# Patient Record
Sex: Female | Born: 1965 | Race: White | Hispanic: No | Marital: Married | Smoking: Former smoker
Health system: Southern US, Community
[De-identification: ages and names within clinical notes are randomized; demographics above are authoritative.]

## PROBLEM LIST (undated history)

## (undated) DIAGNOSIS — E079 Disorder of thyroid, unspecified: Secondary | ICD-10-CM

## (undated) HISTORY — DX: Disorder of thyroid, unspecified: E07.9

---

## 2004-01-01 HISTORY — PX: SPINE SURGERY: SHX786

## 2008-11-08 ENCOUNTER — Encounter: Admission: RE | Admit: 2008-11-08 | Discharge: 2008-11-08 | Payer: Self-pay | Admitting: Orthopedic Surgery

## 2009-12-31 HISTORY — PX: BREAST SURGERY: SHX581

## 2010-11-24 ENCOUNTER — Encounter: Admission: RE | Admit: 2010-11-24 | Discharge: 2010-11-24 | Payer: Self-pay | Admitting: Orthopedic Surgery

## 2010-12-31 HISTORY — PX: SPINE SURGERY: SHX786

## 2011-03-16 ENCOUNTER — Encounter (HOSPITAL_COMMUNITY)
Admission: RE | Admit: 2011-03-16 | Discharge: 2011-03-16 | Disposition: A | Discharge status: Home / Self Care | Payer: Managed Care, Other (non HMO) | Source: Ambulatory Visit | Attending: Orthopedic Surgery | Admitting: Orthopedic Surgery

## 2011-03-16 ENCOUNTER — Other Ambulatory Visit (HOSPITAL_COMMUNITY): Payer: Self-pay | Admitting: Orthopedic Surgery

## 2011-03-16 ENCOUNTER — Ambulatory Visit (HOSPITAL_COMMUNITY)
Admission: RE | Admit: 2011-03-16 | Discharge: 2011-03-16 | Disposition: A | Discharge status: Home / Self Care | Payer: Managed Care, Other (non HMO) | Source: Ambulatory Visit | Attending: Orthopedic Surgery | Admitting: Orthopedic Surgery

## 2011-03-16 DIAGNOSIS — Z01812 Encounter for preprocedural laboratory examination: Secondary | ICD-10-CM | POA: Insufficient documentation

## 2011-03-16 DIAGNOSIS — J329 Chronic sinusitis, unspecified: Secondary | ICD-10-CM | POA: Insufficient documentation

## 2011-03-16 DIAGNOSIS — E039 Hypothyroidism, unspecified: Secondary | ICD-10-CM | POA: Insufficient documentation

## 2011-03-16 DIAGNOSIS — M1990 Unspecified osteoarthritis, unspecified site: Secondary | ICD-10-CM | POA: Insufficient documentation

## 2011-03-16 DIAGNOSIS — M48061 Spinal stenosis, lumbar region without neurogenic claudication: Secondary | ICD-10-CM

## 2011-03-16 DIAGNOSIS — M51379 Other intervertebral disc degeneration, lumbosacral region without mention of lumbar back pain or lower extremity pain: Secondary | ICD-10-CM | POA: Insufficient documentation

## 2011-03-16 DIAGNOSIS — Z01818 Encounter for other preprocedural examination: Secondary | ICD-10-CM | POA: Insufficient documentation

## 2011-03-16 DIAGNOSIS — R569 Unspecified convulsions: Secondary | ICD-10-CM | POA: Insufficient documentation

## 2011-03-16 DIAGNOSIS — K219 Gastro-esophageal reflux disease without esophagitis: Secondary | ICD-10-CM | POA: Insufficient documentation

## 2011-03-16 DIAGNOSIS — M5137 Other intervertebral disc degeneration, lumbosacral region: Secondary | ICD-10-CM | POA: Insufficient documentation

## 2011-03-16 DIAGNOSIS — F411 Generalized anxiety disorder: Secondary | ICD-10-CM | POA: Insufficient documentation

## 2011-03-16 LAB — URINALYSIS, ROUTINE W REFLEX MICROSCOPIC
Glucose, UA: NEGATIVE mg/dL
Hgb urine dipstick: NEGATIVE
Protein, ur: NEGATIVE mg/dL

## 2011-03-16 LAB — COMPREHENSIVE METABOLIC PANEL
BUN: 8 mg/dL (ref 6–23)
Calcium: 9.7 mg/dL (ref 8.4–10.5)
Glucose, Bld: 86 mg/dL (ref 70–99)
Sodium: 138 mEq/L (ref 135–145)
Total Protein: 7.2 g/dL (ref 6.0–8.3)

## 2011-03-16 LAB — DIFFERENTIAL
Basophils Absolute: 0 10*3/uL (ref 0.0–0.1)
Lymphocytes Relative: 37 % (ref 12–46)
Lymphs Abs: 1.9 10*3/uL (ref 0.7–4.0)
Monocytes Absolute: 0.3 10*3/uL (ref 0.1–1.0)
Monocytes Relative: 5 % (ref 3–12)
Neutro Abs: 2.8 10*3/uL (ref 1.7–7.7)

## 2011-03-16 LAB — CBC
HCT: 41.5 % (ref 36.0–46.0)
Hemoglobin: 14.1 g/dL (ref 12.0–15.0)
MCHC: 34 g/dL (ref 30.0–36.0)
MCV: 85 fL (ref 78.0–100.0)

## 2011-03-16 LAB — PROTIME-INR
INR: 0.9 (ref 0.00–1.49)
Prothrombin Time: 12.4 seconds (ref 11.6–15.2)

## 2011-03-16 LAB — TYPE AND SCREEN
ABO/RH(D): A NEG
Antibody Screen: NEGATIVE

## 2011-03-16 LAB — SURGICAL PCR SCREEN
MRSA, PCR: NEGATIVE
Staphylococcus aureus: POSITIVE — AB

## 2011-03-17 LAB — VITAMIN D 25 HYDROXY (VIT D DEFICIENCY, FRACTURES): Vit D, 25-Hydroxy: 38 ng/mL (ref 30–89)

## 2011-03-20 ENCOUNTER — Other Ambulatory Visit: Payer: Self-pay | Admitting: Orthopedic Surgery

## 2011-03-20 ENCOUNTER — Inpatient Hospital Stay (HOSPITAL_COMMUNITY)
Admission: RE | Admit: 2011-03-20 | Discharge: 2011-03-23 | DRG: 460 | Disposition: A | Discharge status: Home / Self Care | Payer: Managed Care, Other (non HMO) | Source: Ambulatory Visit | Attending: Orthopedic Surgery | Admitting: Orthopedic Surgery

## 2011-03-20 ENCOUNTER — Inpatient Hospital Stay (HOSPITAL_COMMUNITY): Payer: Managed Care, Other (non HMO)

## 2011-03-20 DIAGNOSIS — R51 Headache: Secondary | ICD-10-CM | POA: Diagnosis not present

## 2011-03-20 DIAGNOSIS — G40309 Generalized idiopathic epilepsy and epileptic syndromes, not intractable, without status epilepticus: Secondary | ICD-10-CM | POA: Diagnosis present

## 2011-03-20 DIAGNOSIS — M47817 Spondylosis without myelopathy or radiculopathy, lumbosacral region: Secondary | ICD-10-CM | POA: Diagnosis present

## 2011-03-20 DIAGNOSIS — M5126 Other intervertebral disc displacement, lumbar region: Principal | ICD-10-CM | POA: Diagnosis present

## 2011-03-20 DIAGNOSIS — K219 Gastro-esophageal reflux disease without esophagitis: Secondary | ICD-10-CM | POA: Diagnosis present

## 2011-03-20 DIAGNOSIS — E079 Disorder of thyroid, unspecified: Secondary | ICD-10-CM | POA: Diagnosis present

## 2011-03-20 LAB — HCG, SERUM, QUALITATIVE: Preg, Serum: NEGATIVE

## 2011-03-21 LAB — BASIC METABOLIC PANEL
Calcium: 7.7 mg/dL — ABNORMAL LOW (ref 8.4–10.5)
GFR calc Af Amer: >60 mL/min (ref >60)
GFR calc non Af Amer: >60 mL/min (ref >60)
Sodium: 138 mEq/L (ref 135–145)

## 2011-03-21 LAB — CBC
MCHC: 33.7 g/dL (ref 30.0–36.0)
RDW: 14 % (ref 11.5–15.5)

## 2011-03-22 LAB — BASIC METABOLIC PANEL
CO2: 28 mEq/L (ref 19–32)
Calcium: 7.5 mg/dL — ABNORMAL LOW (ref 8.4–10.5)
GFR calc Af Amer: >60 mL/min (ref >60)
Sodium: 137 mEq/L (ref 135–145)

## 2011-03-22 LAB — CBC
Hemoglobin: 7.7 g/dL — ABNORMAL LOW (ref 12.0–15.0)
MCHC: 33.2 g/dL (ref 30.0–36.0)
RBC: 2.68 MIL/uL — ABNORMAL LOW (ref 3.87–5.11)

## 2011-03-22 LAB — HEMOGLOBIN AND HEMATOCRIT, BLOOD
HCT: 24.4 % — ABNORMAL LOW (ref 36.0–46.0)
Hemoglobin: 8.3 g/dL — ABNORMAL LOW (ref 12.0–15.0)

## 2011-03-23 LAB — CBC
HCT: 25 % — ABNORMAL LOW (ref 36.0–46.0)
Platelets: 174 10*3/uL (ref 150–400)
RDW: 13.7 % (ref 11.5–15.5)
WBC: 5.5 10*3/uL (ref 4.0–10.5)

## 2011-03-26 NOTE — Op Note (Signed)
Amber Marquez, Amber Marquez                  ACCOUNT NO.:  [id number]  MEDICAL RECORD NO.:  [id number]           PATIENT TYPE:  I  LOCATION:  5034                         FACILITY:  [facility]  PHYSICIAN:  Nelda Severe, MD      DATE OF BIRTH:  09/11/1966  DATE OF PROCEDURE:  03/20/2011 DATE OF DISCHARGE:                              OPERATIVE REPORT   SURGEON:  Nelda Severe, MD  ASSISTANT:  Lianne Cure, PA  PREOPERATIVE DIAGNOSIS:  Lumbar spondylosis, L4-5 stenosis/L4-5 disk herniation, L5-S1 spondylosis status post prior right L5-S1 laminectomy.  POSTOPERATIVE DIAGNOSIS:  Lumbar spondylosis, L4-5 stenosis/L4-5 disk herniation, L5-S1 spondylosis status post prior right L5-S1 laminectomy.  OPERATIVE PROCEDURE:  Bilateral laminectomy L4-5 and disk excision (separate procedure); posterior interbody fusions L5-S1 right side, L4-5 left side; posterolateral fusion L3 through S1 bilaterally with autogenous iliac crest graft, autogenous local graft, and a Bioglass; pedicle screws and rods L3 through S1 (except left L5); interbody cages L5-S1 and L4-5.  OPERATIVE NOTE:  The patient was placed under general endotracheal anesthesia.  Sequential compression devices were placed on both lower extremities.  A Foley catheter was placed in the bladder.  Monitoring electrodes were attached to upper and lower extremities and scalp.  A 1 g of vancomycin was infused intravenously.  The patient was positioned prone on a Jackson frame.  Care was taken to position the upper extremities so as to avoid hyperflexion and abduction of the shoulders and so as to avoid hyperflexion of the elbows.  Hips, knees, and ankles were gently flexed and the thighs, knees, shins, and feet were supported on pillows.  Lumbar area was prepped with DuraPrep and draped in rectangular fashion after marking the proposed site of incision, midline lower lumbar spine, and a small oblique incision over the right posterior iliac crest  with a skin marker.  Drapes were secured with Ioban.  A time-out was held at which point the usual parameters were discussed/confirmed.  Midline incision was made into the dermis and then the subcutaneous tissue was injected with a mixture of 0.25% plain Marcaine and 1% lidocaine with epinephrine.  Dissection was carried down to the spinous process of the distal lumbosacral spine.  Paraspinal muscles were reflected back bilaterally.  Cross-table lateral radiograph was taken with a Kocher on the spinous process and this conformed to our impression of last mobile segment.  On the right side, I exposed the ala of the sacrum, the transverse process of L4, L5, and L3.  I then packed the wound with antibiotic- soaked sponge and harvested a right posterior iliac crest graft through a short incision using a graft retaining disposable harvesting device. When as much graft as I could collect with this device had been collected, the bony void was packed with Gelfoam to control bleeding and the wound was closed later.  I then harvested some more bone graft locally by doing a facet joint resection on the right at L5-S1, L4-5, and L3-4.  This bone was morselized with the iliac crest graft and mixed with 10 mL of Bioglass.  I then made many pedicle holes at S1,  L5, L4, and L3 on the right side. This was done in the usual fashion, removing a small piece of bone from the base of superior articular process, using a pedicle probe to find and make a hole through the pedicle into the vertebral body.  Each hole was carefully palpated with a ball-tip probe to make sure it was circumferentially intact and sounded for depth.  I then decorticated the ala of the sacrum and the transverse processes at each level and bone graft was packed in posterolaterally.  We then placed S1, L5, L4, and L3 screws sequentially having tapped the holes appropriately.  The 7.5-mm screw was used at S1 and 6.5-mm diameter screws  at the other levels. Lengths were determined on the depth determinations for the holes.  I then proceeded with a posterior interbody fusion done through the right side at L5-S1, the site of a previous laminectomy.  Fortunately, the scar in the area was very flimsy.  The margins of the lamina were easily identified.  The apophyseal joint at L5-S1 on the right side was then resected using a combination of Kerrison and Leksell rongeurs.  The veins in the neural foramina were controlled with bipolar coagulation and the posterior aspect of the disk was identified.  The disk was then fenestrated.  It was very narrow.  I was able to get a 6-mm disk scraper into the disk and began the process of disk enucleation.  Ultimately, I placed a working rod between the screws of L5 and S1 on the right side and distracted it to create more space.  The disk was further enucleated and endplates were prepared using curettes.  We then used a Titan cage, 22 mm in length and 7 mm thick, packed with graft to insert into the interspace.  Prior to that, I used a final packed bone graft into the anterior L5-S1 disk space.  At this point, we stimulated all of pedicle screws and levels of distal EMG stimulation were all in a safe zone.  An appropriate length titanium rod was contoured and provisionally attached to the screws on the right side.  I then switched sides.  The ala of the sacrum and transverse processes of L5 through L3 proximally were exposed.  We then placed pedicle screws at S1 through L3 proximally in the same fashion as described for the right side, after raising some more local bone graft, adding it to the admixture of bone graft, preparing the ala of the sacrum and the transverse processes with decortication and placing posterolateral graft material from L3 through S1.  The screws were then placed on the patient's left side.  They were stimulated, and the levels to achieve distal EMG stimulation  were all in a safe zone.  I then began to do a posterior interbody fusion on the left side at L4- 5.  This was done because, all the patient has stenosis and a right- sided sciatic pain, there was a moderately large disk herniation superimposed upon the bony stenosis on the left side at L4-5.  A complete facetectomy was carried out, veins in the neural foramen bipolar coagulated, and the disk identified.  It was fenestrated and then enucleated in a similar fashion to that described for L5-S1, although there was much more disk material in this disk and an obvious posterolateral disk herniation.  Once I had satisfactorily prepared the endplates, an appropriate size Titan implant was packed with bone graft and having placed bone graft anteriorly in the disk space.  The implant was impacted into place.  Cross-table lateral radiograph was taken and showed satisfactory position of the implants except for the left L5 screw which had extruded into the L4-5 disk space proximally.  It was removed, and I left it out feeling that there was not really space to redirect a hole more distally and that we had very good fixation on the patient's right side.  I then switched sides of the table again and completed a laminectomy at L4-5 on the patient's right side because that was a symptomatic side. Partial facetectomy was carried out and the neural foramen was completely decompressed.  The L5 nerve root was visualized in the L5-S1 neural foramen below and at its origin at the L4-5 level above and a Murphy ball probe was used to follow the nerve root distally around the lamina and into the neural foramen at L5-S1.  The L4-5 neural foramen on the right side was also palpated with a Murphy ball probe and was opened.  We then used a high-speed bur to remove the articular cartilage from the superior articular process of L3 bilaterally and further decorticated the lamina of L3 bilaterally.  Bone graft  mixture was then packed into the L3-4 facet joints bilaterally.  We used the bone graft/Bioglass mixture which was left to reinforce the posterolateral fusion on the left from L3 through S1.  The laminectomies were all reinspected and a few pieces of bone graft were removed.  Pieces of Gelfoam were laid superficial to the laminectomy site to prevent the ingress of bone graft material.  We placed pieces of Gelfoam over the posterior bone graft at L3-4 to prevent it floating around in the wound.  Not mentioned above is the fact that a rod was also contoured for the left side and attached to the screws.  When a final cross-table lateral radiograph was taken which showed satisfactory position of all implants, the screws were torqued.  An 15-gauge Blake drain was placed subfascially and secured to the skin with a 2-0 nylon on the right side.  The thoracolumbar fascia was reapposed using a continuous #1 Vicryl suture.  A one-eighth-inch Hemovac drain was taken from the bone graft harvest site subcutaneous portion of the wound and run through the central wound and out through the skin to the right side, again being secured with a 2-0 nylon.  The central lumbar wound was then closed using 2-0 inverted Vicryl sutures in the subcutaneous layer as was the bone graft harvest subcutaneous layer.  The skin of both wounds was closed using subcuticular 3-0 undyed Vicryl in running fashion.  The skin edges were reinforced with Steri- Strips and a nonadherent dressing applied.  Blood loss estimated at 500-600 mL.  The patient received approximately 250 mL of Cell Saver blood.  There were no intraoperative complications. Sponge, needle counts were correct.  At the time of dictation, the patient has not been transferred to the recovery room, so no neurologic examination reported here.     Nelda Severe, MD     MT/MEDQ  D:  03/20/2011  T:  03/21/2011  Job:  [id number]  Electronically Signed by  Nelda Severe MD on 03/26/2011 02:13:26 PM

## 2011-04-04 NOTE — H&P (Signed)
  NAMEBETTYANNE, Amber Marquez                  ACCOUNT NO.:  [id number]  MEDICAL RECORD NO.:  [id number]           PATIENT TYPE:  O  LOCATION:  XRAY                         FACILITY:  [facility]  PHYSICIAN:  Nelda Severe, MD      DATE OF BIRTH:  1966/03/12  DATE OF ADMISSION:  03/16/2011 DATE OF DISCHARGE:  03/16/2011                             HISTORY & PHYSICAL   CHIEF COMPLAINT:  Lumbar pain with right lateral posterior leg sciatic pain.  She does have left hip and buttock pain.  ALLERGIES:  She has no known drug allergies.  CURRENT MEDICATIONS: 1. Hydrocodone 10 one to two q.4-6 p.r.n. for pain. 2. Neurontin 800 mg 4 times a day. 3. Synthroid 75 mg once a day. 4. Estrin birth control daily. 5. Flexeril 10 mg one q.8 p.r.n. for muscle spasms.  PAST MEDICAL HISTORY:  Breast reduction and lumbar laminectomy.  She has a medical history of osteoarthritis, acid reflux, hypothyroidism and new onset of seizure unknown cause and anxiety.  SOCIAL HISTORY:  She does not smoke and has never smoked.  She drinks socially.  FAMILY HISTORY:  Diabetes and hypertension.  REVIEW OF SYSTEMS:  She reports no fever, no chills, no night sweats, no nausea, vomiting or diarrhea.  No shortness of breath.  No chest pain. No chronic cough.  PHYSICAL EXAMINATION:  GENERAL:  She is normocephalic. EYES:  Pupils are equal, round and reactive to light. NECK:  Supple.  No adenopathy noted. CHEST:  Clear to auscultation bilaterally with no wheezes noted. HEART:  Regular rate and rhythm.  No murmur noted. ABDOMEN:  Soft, nontender to palpation.  Positive bowel sounds auscultated. EXTREMITIES:  She has right lower extremity posterior lateral pain radiation sciatic, left buttock and hip pain, tenderness to palpation L3-S1 centrally.  On the MRI and x-ray, she has degenerative disk at L3-4, L4-5, L5-S1, status post laminectomy with herniated disk at L3-4 central and L4-5 right-sided herniation.  PLAN:  L3-S1  posterior lumbar fusion with iliac crest bone graft harvest by Dr. Nelda Severe.     Lianne Cure, P.A.   ______________________________ Nelda Severe, MD    MC/MEDQ  D:  03/16/2011  T:  03/17/2011  Job:  [id number]  Electronically Signed by Lianne Cure P.A. on 03/29/2011 08:37:07 AM Electronically Signed by Nelda Severe MD on 04/04/2011 09:05:17 AM

## 2011-04-17 NOTE — Discharge Summary (Signed)
  [name], [name]                  ACCOUNT NO.:  [id number]  MEDICAL RECORD NO.:  [id number]           PATIENT TYPE:  I  LOCATION:  5034                         FACILITY:  [facility]  PHYSICIAN:  Amber Severe, MD      DATE OF BIRTH:  1966/11/10  DATE OF ADMISSION:  03/20/2011 DATE OF DISCHARGE:  03/23/2011                              DISCHARGE SUMMARY   FINAL DIAGNOSIS:  Lumbar spondylosis with herniated disk, L3 through S1.  BRIEF HISTORY:  Amber was brought to [facility] on March 19, 2010, and underwent surgery by Dr. Nelda Marquez to include L3-S1 fusion.  Estimated blood loss was 600 mL.  Amber was stable postoperatively, admitted to room 5034.  Amber was given a Dilaudid PCA and Norco 10 one to two q.4 h. p.r.n. for pain control.  Postop day #2, Amber developed a headache, some nausea, vomiting.  We discontinued the PCA Dilaudid and changed her to fentanyl 50 mcg patch for slow release pain control and maintained the hydrocodone 10 one q.4 h.  This seemed to help other than her headache.  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  Her doctor had discussed Imitrex today, ordered Imitrex 25 one p.o., may repeat in 2 hours if headaches are not resolved.  Amber is a normal caffeine user.  Amber has not been drinking caffeine regularly, however, Amber is welcome to drink coffee and Amber has passed flatulence.  Amber is urinating independently, walking in the room with a rolling walker.  Her incision is clean, dry, and intact.  No active drainage.  FINAL DIAGNOSIS:  L3-S1 herniated disk spondylosis.  PLAN:  Amber will be discharged home in the care of her husband, a rolling walker, and a raised toilet seat will be provided.  Sending her home with fentanyl patches 50 mcg one q.72 h., 5 mg Valium one q.6 h. p.r.n. for muscle spasms, Senokot one p.o. nightly, and hydrocodone 10/325 one to two q.4 h. p.r.n. for pain breakthrough.  Amber will walk for exercise. No  bending, stooping, or lifting.  Regular diet.  Amber may shower. Disposition is stable.     Amber Marquez, P.A.   ______________________________ Amber Severe, MD    MC/MEDQ  D:  03/23/2011  T:  03/24/2011  Job:  [id number]  Electronically Signed by Amber Marquez P.A. on 04/13/2011 08:48:30 AM Electronically Signed by Amber Severe MD on 04/17/2011 05:54:20 PM

## 2011-04-19 ENCOUNTER — Other Ambulatory Visit: Payer: Self-pay | Admitting: Orthopedic Surgery

## 2011-04-19 DIAGNOSIS — M25532 Pain in left wrist: Secondary | ICD-10-CM

## 2011-04-20 ENCOUNTER — Ambulatory Visit
Admission: RE | Admit: 2011-04-20 | Discharge: 2011-04-20 | Disposition: A | Discharge status: Home / Self Care | Payer: Managed Care, Other (non HMO) | Source: Ambulatory Visit | Attending: Orthopedic Surgery | Admitting: Orthopedic Surgery

## 2011-04-20 DIAGNOSIS — M25532 Pain in left wrist: Secondary | ICD-10-CM

## 2012-05-01 ENCOUNTER — Other Ambulatory Visit: Payer: Self-pay | Admitting: Orthopedic Surgery

## 2012-05-01 DIAGNOSIS — Z981 Arthrodesis status: Secondary | ICD-10-CM

## 2012-05-01 DIAGNOSIS — M5126 Other intervertebral disc displacement, lumbar region: Secondary | ICD-10-CM

## 2012-05-05 ENCOUNTER — Ambulatory Visit
Admission: RE | Admit: 2012-05-05 | Discharge: 2012-05-05 | Disposition: A | Discharge status: Home / Self Care | Payer: Managed Care, Other (non HMO) | Source: Ambulatory Visit | Attending: Orthopedic Surgery | Admitting: Orthopedic Surgery

## 2012-05-05 DIAGNOSIS — M5126 Other intervertebral disc displacement, lumbar region: Secondary | ICD-10-CM

## 2012-05-05 DIAGNOSIS — Z981 Arthrodesis status: Secondary | ICD-10-CM

## 2013-08-10 ENCOUNTER — Encounter: Payer: Self-pay | Admitting: Physical Medicine & Rehabilitation

## 2013-09-07 ENCOUNTER — Encounter: Payer: Managed Care, Other (non HMO) | Attending: Physical Medicine & Rehabilitation

## 2013-09-07 ENCOUNTER — Ambulatory Visit (HOSPITAL_BASED_OUTPATIENT_CLINIC_OR_DEPARTMENT_OTHER): Payer: Managed Care, Other (non HMO) | Admitting: Physical Medicine & Rehabilitation

## 2013-09-07 ENCOUNTER — Encounter: Payer: Self-pay | Admitting: Physical Medicine & Rehabilitation

## 2013-09-07 VITALS — BP 128/96 | HR 84 | Resp 14 | Ht 63.0 in | Wt 146.4 lb

## 2013-09-07 DIAGNOSIS — M545 Low back pain, unspecified: Secondary | ICD-10-CM

## 2013-09-07 DIAGNOSIS — M5416 Radiculopathy, lumbar region: Secondary | ICD-10-CM | POA: Insufficient documentation

## 2013-09-07 DIAGNOSIS — M533 Sacrococcygeal disorders, not elsewhere classified: Secondary | ICD-10-CM | POA: Insufficient documentation

## 2013-09-07 DIAGNOSIS — M7061 Trochanteric bursitis, right hip: Secondary | ICD-10-CM

## 2013-09-07 DIAGNOSIS — IMO0001 Reserved for inherently not codable concepts without codable children: Secondary | ICD-10-CM | POA: Insufficient documentation

## 2013-09-07 DIAGNOSIS — Z79899 Other long term (current) drug therapy: Secondary | ICD-10-CM

## 2013-09-07 DIAGNOSIS — M961 Postlaminectomy syndrome, not elsewhere classified: Secondary | ICD-10-CM

## 2013-09-07 DIAGNOSIS — Z5181 Encounter for therapeutic drug level monitoring: Secondary | ICD-10-CM

## 2013-09-07 DIAGNOSIS — M76899 Other specified enthesopathies of unspecified lower limb, excluding foot: Secondary | ICD-10-CM

## 2013-09-07 DIAGNOSIS — M329 Systemic lupus erythematosus, unspecified: Secondary | ICD-10-CM

## 2013-09-07 DIAGNOSIS — IMO0002 Reserved for concepts with insufficient information to code with codable children: Secondary | ICD-10-CM

## 2013-09-07 MED ORDER — NORTRIPTYLINE HCL 10 MG PO CAPS: 10.0000 mg | ORAL_CAPSULE | Freq: Every day | ORAL | Status: AC

## 2013-09-07 NOTE — Patient Instructions (Signed)
Will need a driver for next injection 

## 2013-09-07 NOTE — Progress Notes (Signed)
Subjective:    Patient ID: Amber Marquez, female    DOB: 05-07-1966, 47 y.o.   MRN: [id number] CC:Low back,Right  Lateral thigh and lat foot numbness and tingling pain HPI 11 yr hx  Of sciatic pain down Right side. Had surgery with Dr Wynona Luna L5 Decompression March 2005 L3-4-5-S1 fusion March 2012  Pre op MRI 2011 showing R S1 Epidural fibrosis prior R L5 laminectomy Also L L5 nerve root impingement  CT Lumbar 2013 intact fusion , no significant adjacent level degeneration  Low back pain with ambulation  Was taking 2-4 Norco 10/325 in May but now up to ~6 tablets per day because of increasing back pain Out of cyclobenzaprine but this was helpful at night Was on lyrica 200mg  BID but not much improvement Tried Gabapentin 2400mg /day without much improvement Tried epidural injections without relief  Had EMG/NCV- no "nerve damage"  PMH SLE on Plaquenil Pain Inventory Average Pain 8 Pain Right Now 7 My pain is sharp, stabbing and aching  In the last 24 hours, has pain interfered with the following? General activity 6 Relation with others 0 Enjoyment of life 8 What TIME of day is your pain at its worst? daytime and evening Sleep (in general) Poor  Pain is worse with: walking and inactivity Pain improves with: medication Relief from Meds: 6  Mobility walk without assistance how many minutes can you walk? 30 ability to climb steps?  no do you drive?  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  no  Physicians involved in your care tooke   History reviewed. No pertinent family history. History   Social History  . Marital Status: Married    Spouse Name: N/A    Number of Children: N/A  . Years of Education: N/A   Social History Main Topics  . Smoking status: Former Smoker    Types: Cigarettes  . Smokeless tobacco: Never Used   . Alcohol Use: None  . Drug Use: None  . Sexual Activity: None   Other Topics Concern  . None   Social History Narrative  . None   Past Surgical History  Procedure Laterality Date  . Spine surgery  2005    L 5 decompression  . Spine surgery  2012    L 2 /S1 fusion  . Breast surgery  2011    reduction   Past Medical History  Diagnosis Date  . Thyroid disease    BP 128/96  Pulse 84  Resp 14  Ht 5\' 3"  (1.6 m)  Wt 146 lb 6.4 oz (66.407 kg)  BMI 25.94 kg/m2  SpO2 100%     Review of Systems  Gastrointestinal: Positive for nausea and diarrhea.  Musculoskeletal: Positive for back pain.       Spasms  Neurological: Positive for weakness and numbness.       Tingling  Psychiatric/Behavioral: Positive for dysphoric mood.  All other systems reviewed and are negative.       Objective:   Physical Exam  Nursing note and vitals reviewed. Constitutional: She is oriented to person, place, and time. She appears well-developed and well-nourished.  HENT:  Head: Normocephalic and atraumatic.  Eyes: Conjunctivae and EOM are normal. Pupils are equal, round, and reactive to light.  Musculoskeletal:       Right hip: She exhibits decreased range of motion.       Lumbar back: She  exhibits decreased range of motion, tenderness and deformity. She exhibits no edema.  Neurological: She is alert and oriented to person, place, and time. She has normal strength. A sensory deficit is present. She exhibits normal muscle tone. Gait normal.  Reflex Scores:      Patellar reflexes are 2+ on the right side and 2+ on the left side.      Achilles reflexes are 0 on the right side and 2+ on the left side. Psychiatric: She has a normal mood and affect.   +FABER's R>L SI area -SLR     Assessment & Plan:  1.  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 In addition pt has a  chronic R S1 radiculitis,no sig response to adequate trials of Lyrica and Gabapentin, Will trial nortriptyline 10mg  qhs, discussed dry mouth as common SE  Pt has goal of minimizing narcotic analgesic ORT is low and pt has significant lumbar abnormailities, check UDS adn may take over hydrocodone Rx Other possibility includes Nucynta which can address both radicular and axial pain.

## 2013-09-15 NOTE — Progress Notes (Signed)
Yes , can precribe hydrocodone but will check UDS and pill count at first f/u visit

## 2013-09-24 ENCOUNTER — Ambulatory Visit (HOSPITAL_BASED_OUTPATIENT_CLINIC_OR_DEPARTMENT_OTHER): Payer: Managed Care, Other (non HMO) | Admitting: Physical Medicine & Rehabilitation

## 2013-09-24 ENCOUNTER — Encounter: Payer: Self-pay | Admitting: Physical Medicine & Rehabilitation

## 2013-09-24 VITALS — BP 152/92 | HR 85 | Resp 14 | Ht 63.0 in | Wt 145.0 lb

## 2013-09-24 DIAGNOSIS — M533 Sacrococcygeal disorders, not elsewhere classified: Secondary | ICD-10-CM

## 2013-09-24 MED ORDER — TAPENTADOL HCL 50 MG PO TABS: 50.0000 mg | ORAL_TABLET | ORAL | Status: DC

## 2013-09-24 MED ORDER — TAPENTADOL HCL 50 MG PO TABS: 50.0000 mg | ORAL_TABLET | Freq: Three times a day (TID) | ORAL | Status: DC | PRN

## 2013-09-24 NOTE — Progress Notes (Signed)
  PROCEDURE RECORD [facility]   Name: Tiasha Helvie DOB:1966/07/05 MRN: [id number]  Date:09/24/2013  Physician: Claudette Laws, MD    Nurse/CMA: Kelli Churn, CMA  Allergies:  Allergies  Allergen Reactions  . Fentanyl And Related Nausea And Vomiting    Consent Signed: yes  Is patient diabetic? no    Pregnant: no LMP: No LMP recorded. Patient is not currently having periods (Reason: IUD). (age 48-55)  Anticoagulants: no Anti-inflammatory: no Antibiotics: no  Procedure: Right sacroiliac injection  Position: Prone Start Time:  945 End Time:  949 Fluoro Time: 8  RN/CMA Zavior Thomason, CMA Neytiri Asche, CMA    Time 904 951    BP 152/92 144/92    Pulse 85 112    Respirations 14 14    O2 Sat 100 96    S/S 6 6    Pain Level 7/10 7/10     D/C home with husband-Tony, patient A & O X 3, D/C instructions reviewed, and sits independently.

## 2013-09-24 NOTE — Progress Notes (Signed)
Left sacroiliac injection under fluoroscopic guidance  Indication: Left Low back and buttocks pain not relieved by medication management and other conservative care.  Informed consent was obtained after describing risks and benefits of the procedure with the patient, this includes bleeding, bruising, infection, paralysis and medication side effects. The patient wishes to proceed and has given written consent. The patient was placed in a prone position. The lumbar and sacral area was marked and prepped with Betadine. A 25-gauge 1-1/2 inch needle was inserted into the skin and subcutaneous tissue and 1 mL of 1% lidocaine was injected. Then a 25-gauge 3 inch spinal needle was inserted under fluoroscopic guidance into the left sacroiliac joint. AP and lateral images were utilized. Omnipaque 180x0.5 mL under live fluoroscopy demonstrated no intravascular uptake. Then a solution containing one ML of 40 mg per mL depomedrol and 2 ML of 1% lidocaine MPF was injected x1.5 mL. Patient tolerated the procedure well. Post procedure instructions were given. Please see post procedure form. 

## 2013-09-30 ENCOUNTER — Ambulatory Visit (INDEPENDENT_AMBULATORY_CARE_PROVIDER_SITE_OTHER): Payer: Managed Care, Other (non HMO) | Admitting: Physical Therapy

## 2013-09-30 DIAGNOSIS — M6281 Muscle weakness (generalized): Secondary | ICD-10-CM

## 2013-09-30 DIAGNOSIS — M2550 Pain in unspecified joint: Secondary | ICD-10-CM

## 2013-09-30 DIAGNOSIS — M961 Postlaminectomy syndrome, not elsewhere classified: Secondary | ICD-10-CM

## 2013-10-05 ENCOUNTER — Encounter: Payer: Managed Care, Other (non HMO) | Admitting: Physical Therapy

## 2013-10-13 ENCOUNTER — Encounter (INDEPENDENT_AMBULATORY_CARE_PROVIDER_SITE_OTHER): Payer: Managed Care, Other (non HMO) | Admitting: Physical Therapy

## 2013-10-13 DIAGNOSIS — M961 Postlaminectomy syndrome, not elsewhere classified: Secondary | ICD-10-CM

## 2013-10-13 DIAGNOSIS — M6281 Muscle weakness (generalized): Secondary | ICD-10-CM

## 2013-10-13 DIAGNOSIS — M2550 Pain in unspecified joint: Secondary | ICD-10-CM

## 2013-10-16 ENCOUNTER — Encounter (INDEPENDENT_AMBULATORY_CARE_PROVIDER_SITE_OTHER): Payer: Managed Care, Other (non HMO) | Admitting: Physical Therapy

## 2013-10-16 DIAGNOSIS — M6281 Muscle weakness (generalized): Secondary | ICD-10-CM

## 2013-10-16 DIAGNOSIS — M961 Postlaminectomy syndrome, not elsewhere classified: Secondary | ICD-10-CM

## 2013-10-16 DIAGNOSIS — M2550 Pain in unspecified joint: Secondary | ICD-10-CM

## 2013-10-20 ENCOUNTER — Ambulatory Visit: Payer: Managed Care, Other (non HMO) | Admitting: Physical Medicine & Rehabilitation

## 2013-10-22 DIAGNOSIS — M2550 Pain in unspecified joint: Secondary | ICD-10-CM

## 2013-10-22 DIAGNOSIS — M6281 Muscle weakness (generalized): Secondary | ICD-10-CM

## 2013-10-22 DIAGNOSIS — M961 Postlaminectomy syndrome, not elsewhere classified: Secondary | ICD-10-CM

## 2013-10-26 ENCOUNTER — Ambulatory Visit (HOSPITAL_BASED_OUTPATIENT_CLINIC_OR_DEPARTMENT_OTHER): Payer: Managed Care, Other (non HMO) | Admitting: Physical Medicine & Rehabilitation

## 2013-10-26 ENCOUNTER — Encounter: Payer: Self-pay | Admitting: Physical Medicine & Rehabilitation

## 2013-10-26 ENCOUNTER — Encounter: Payer: Managed Care, Other (non HMO) | Attending: Physical Medicine & Rehabilitation

## 2013-10-26 VITALS — BP 156/92 | HR 93 | Resp 14 | Ht 64.0 in | Wt 146.2 lb

## 2013-10-26 DIAGNOSIS — M76899 Other specified enthesopathies of unspecified lower limb, excluding foot: Secondary | ICD-10-CM

## 2013-10-26 DIAGNOSIS — M5416 Radiculopathy, lumbar region: Secondary | ICD-10-CM

## 2013-10-26 DIAGNOSIS — IMO0002 Reserved for concepts with insufficient information to code with codable children: Secondary | ICD-10-CM

## 2013-10-26 DIAGNOSIS — IMO0001 Reserved for inherently not codable concepts without codable children: Secondary | ICD-10-CM | POA: Insufficient documentation

## 2013-10-26 DIAGNOSIS — M545 Low back pain, unspecified: Secondary | ICD-10-CM | POA: Insufficient documentation

## 2013-10-26 DIAGNOSIS — M329 Systemic lupus erythematosus, unspecified: Secondary | ICD-10-CM

## 2013-10-26 DIAGNOSIS — M533 Sacrococcygeal disorders, not elsewhere classified: Secondary | ICD-10-CM

## 2013-10-26 DIAGNOSIS — M7061 Trochanteric bursitis, right hip: Secondary | ICD-10-CM

## 2013-10-26 DIAGNOSIS — M961 Postlaminectomy syndrome, not elsewhere classified: Secondary | ICD-10-CM

## 2013-10-26 MED ORDER — TAPENTADOL HCL 50 MG PO TABS: 50.0000 mg | ORAL_TABLET | Freq: Two times a day (BID) | ORAL | Status: DC

## 2013-10-26 MED ORDER — TIZANIDINE HCL 4 MG PO TABS: 4.0000 mg | ORAL_TABLET | Freq: Every evening | ORAL | Status: DC | PRN

## 2013-10-26 NOTE — Progress Notes (Signed)
Subjective:    Patient ID: Amber Marquez, female    DOB: December 16, 1966, 47 y.o.   MRN: [id number] 10 yr hx  Of sciatic pain down Right side. 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 General activity 7 Relation with others 3 Enjoyment of life 6 What TIME of day is your pain at its worst? evening and night Sleep (in general) Fair  Pain is worse with: walking and sitting Pain improves with: heat/ice, therapy/exercise and medication Relief from Meds: 7  Mobility walk without assistance how many minutes can you walk? 10-20 ability to climb steps?  no do you drive?  yes  Function employed # of hrs/week 40  Neuro/Psych numbness spasms depression  Prior Studies Any changes since last visit?  no  Physicians involved in your care Any changes since last visit?  no   Family History  Problem Relation Age of Onset  . Multiple sclerosis Father    History   Social History  . Marital Status: Married    Spouse Name: N/A    Number of Children: N/A  . Years of Education: N/A   Social History Main Topics  . Smoking status: Former Smoker    Types: Cigarettes  . Smokeless tobacco: Never Used  . Alcohol Use: None  . Drug Use: None  . Sexual Activity: None   Other Topics Concern  . None   Social History Narrative  . None   Past Surgical History  Procedure Laterality Date  . Spine surgery  2005    L 5 decompression  . Spine surgery  2012    L 2 /S1 fusion  . Breast surgery  2011    reduction   Past Medical History  Diagnosis  Date  . Thyroid disease    BP 156/92  Pulse 93  Resp 14  Ht 5\' 4"  (1.626 m)  Wt 146 lb 3.2 oz (66.316 kg)  BMI 25.08 kg/m2  SpO2 100%     Review of Systems  Constitutional: Positive for appetite change.  Musculoskeletal:       Spasms  Neurological: Positive for numbness.  Psychiatric/Behavioral: Positive for dysphoric mood.  All other systems reviewed and are negative.       Objective:   Physical Exam  Head: Normocephalic and atraumatic.  Eyes: Conjunctivae and EOM are normal. Pupils are equal, round, and reactive to light.  Musculoskeletal:       Right hip: She exhibits decreased range of motion.       Lumbar back: She exhibits decreased range of motion, tenderness and deformity. She exhibits no edema.  Neurological: She is alert and oriented to person, place, and time. She has normal strength. A sensory deficit is present. She exhibits normal muscle tone. Gait normal.  Reflex Scores:      Patellar reflexes are 2+ on the right side and 2+ on the left side.      Achilles reflexes are 0 on the right side and 2+ on the left side. Psychiatric: She has a normal mood and affect.  L>R PSIS  tenderness -SLR      Assessment & Plan:  1.  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 In addition pt has a chronic R S1 radiculitis,no sig response to adequate trials of Lyrica and Gabapentin,  2.  SI joint pain post fusion repeat SI injection Pt has goal of minimizing narcotic analgesic ORT is low and pt has significant lumbar abnormailities, check UDS adn may take over hydrocodone Rx reduce Nucynta 50mg  BID which can address both radicular and axial pain.

## 2013-10-28 ENCOUNTER — Encounter: Payer: Managed Care, Other (non HMO) | Admitting: Physical Therapy

## 2013-11-03 ENCOUNTER — Encounter: Payer: Self-pay | Admitting: Physical Therapy

## 2013-11-08 IMAGING — CT CT L SPINE W/O CM
4 of 11 series · 12 of 33 positions shown, 14 images · non-contrast
Comparison: Lumbar radiographs 03/20/2011.  MRI 11/24/2010.

CLINICAL DATA: 45-year-old female with pain radiating to both lower
extremities.  Prior surgery.

CT LUMBAR SPINE WITHOUT CONTRAST
TECHNIQUE: Multidetector CT imaging of the lumbar spine was
performed without intravenous contrast administration. Multiplanar
CT image reconstructions were also generated.

[Series 2: l spine bone · axial · 0.27mm/px · z∈[-179,-106]mm · 2 of 87 slices shown, 3 images]
[im 29/87  soft-tissue]
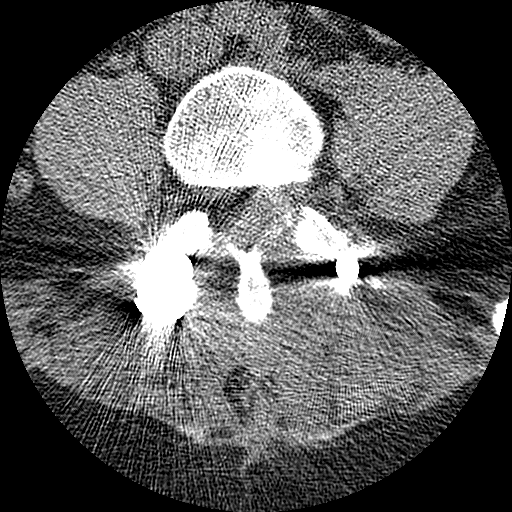
[im 29/87  bone]
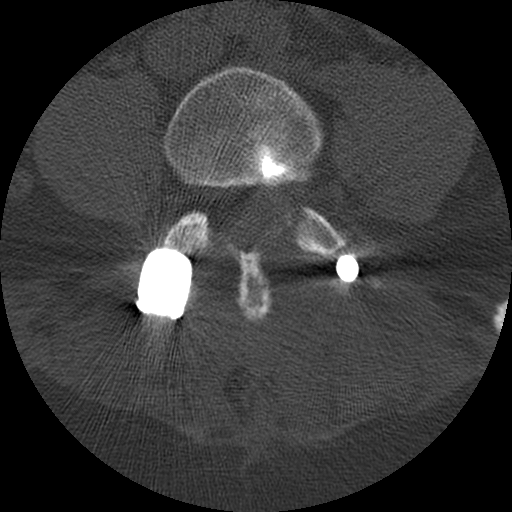
[im 58/87  bone]
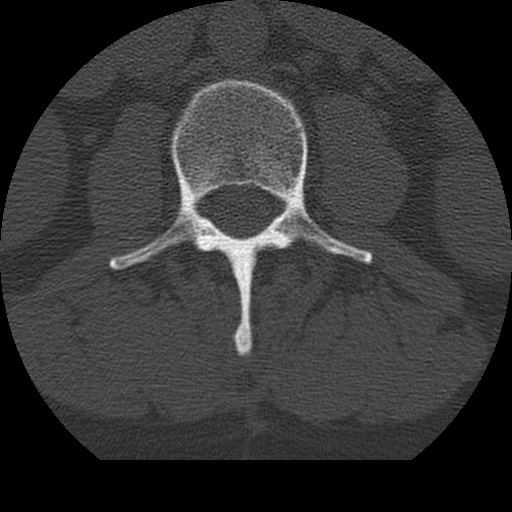

[Series 3: l spine soft · axial · 0.27mm/px · z∈[-179,-106]mm · 2 of 87 slices shown]
[im 29/87  soft-tissue]
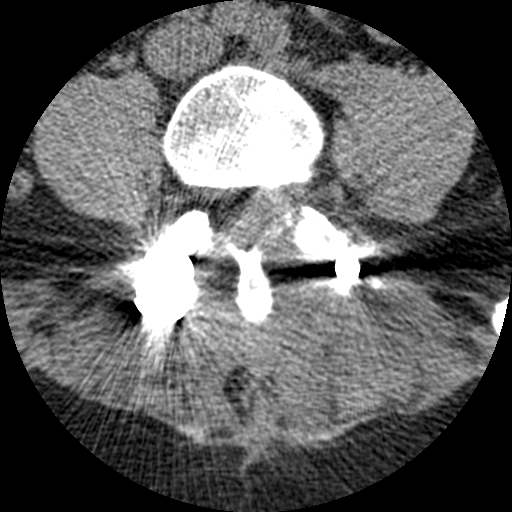
[im 58/87  soft-tissue]
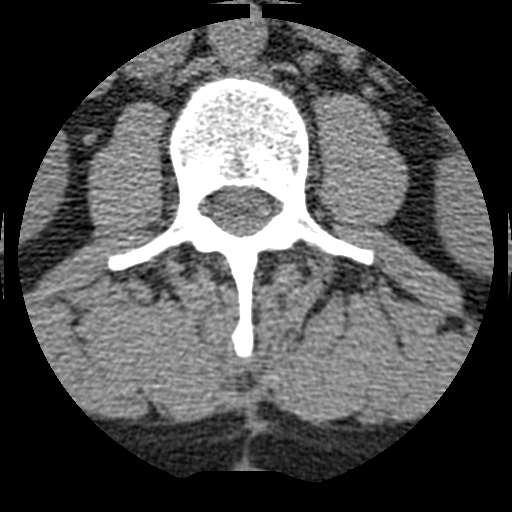

[Series 401: cor lower · coronal · 0.43mm/px · 3 of 44 slices shown]
[im 6/44  bone]
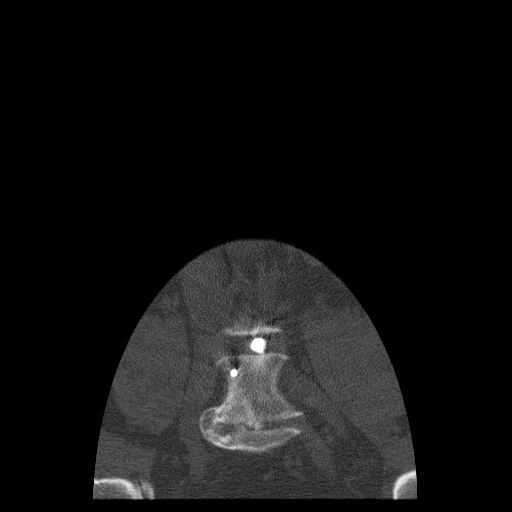
[im 18/44  bone]
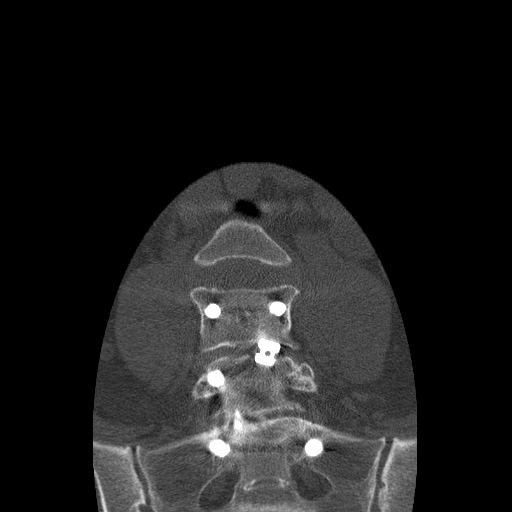
[im 31/44  bone]
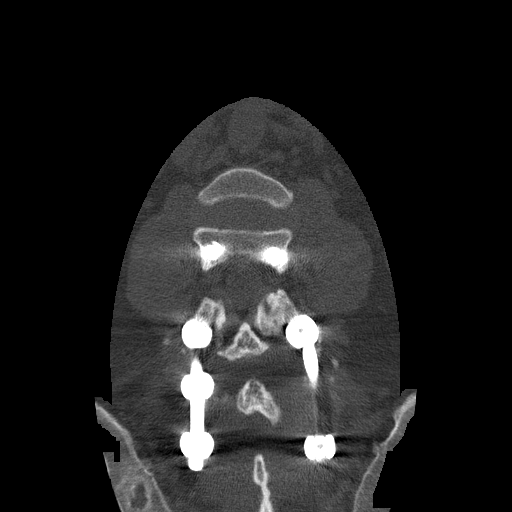

[Series 402: sag · sagittal · 0.43mm/px · 5 of 52 slices shown, 6 images]
[im 18/52  bone]
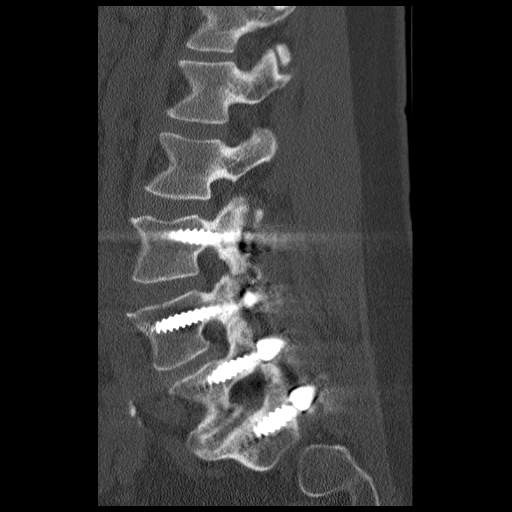
[im 22/52  bone]
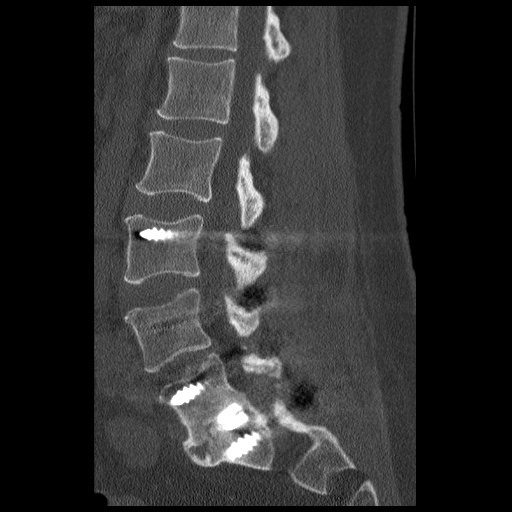
[im 26/52  soft-tissue]
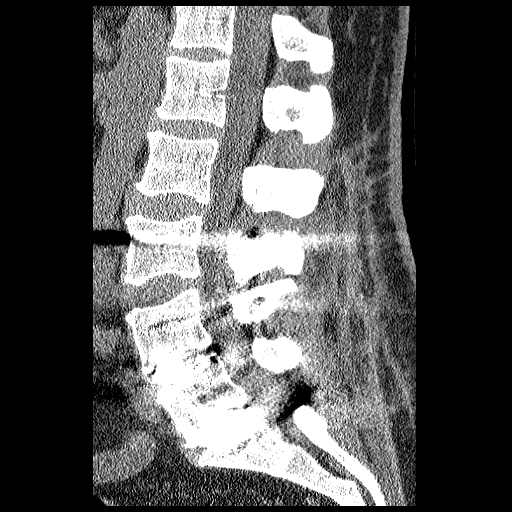
[im 26/52  bone]
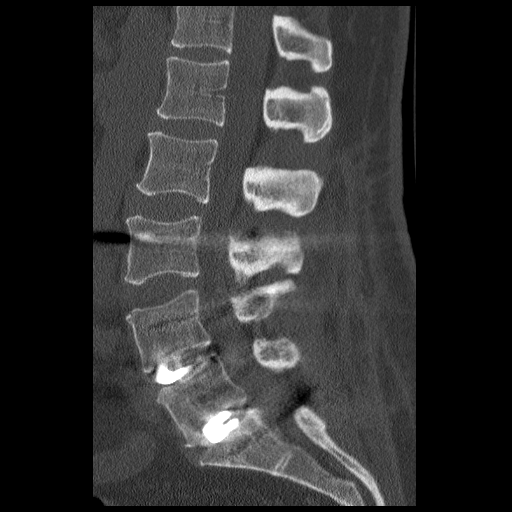
[im 30/52  bone]
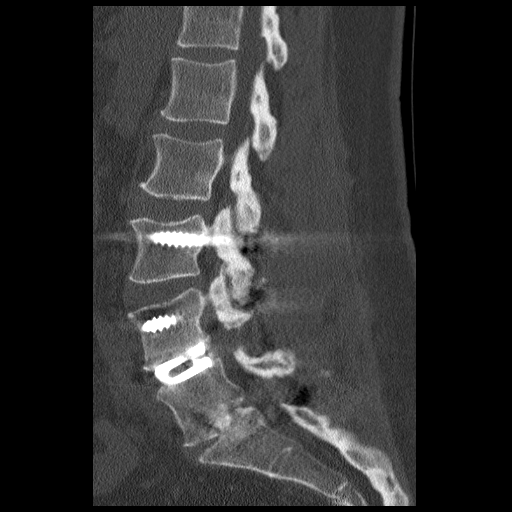
[im 35/52  bone]
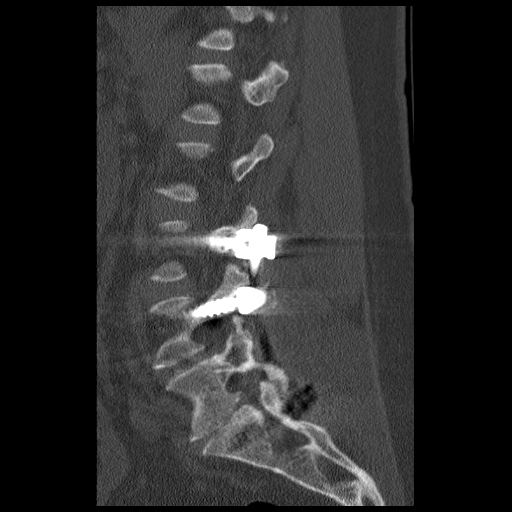

[12 of 33 positions shown; findings below may reference images not displayed]

FINDINGS: Mild postoperative changes in the paraspinal soft
tissues.  Negative visualized abdominal viscera.

Vertebral height and alignment within normal limits in the lumbar
spine.  Postoperative changes from the L3 level to the sacrum as
detailed below.  Postoperative changes in medial right iliac bone.
SI joints within normal limits. No acute osseous abnormality
identified.

T12-L1:  Mild to moderate facet hypertrophy.  No significant
stenosis.

L1-L2:  Negative.

L2-L3:  Mild circumferential disc bulge.  No significant stenosis.

L3-L4:  Bilateral L3 and L4 transpedicular hardware is intact
without evidence of loosening.  Streak artifact affects
visualization of the thecal sac at some levels.  Moderate to severe
facet hypertrophy.  No definite stenosis.

L4-L5:  Sequelae of transpedicular and interbody fusion.  Interbody
and posterior element arthrodesis is identified (posterior element
fusion greater on the right).  Residual posterior element
hypertrophy.  Streak artifact affects visualization of the thecal
sac.  No definite stenosis.

L5-S1:  Sequelae of transpedicular and interbody fusion.  Right
unilateral L5 and bilateral S1 transpedicular hardware is intact
without evidence of loosening.  Interbody implant in place.  Solid
interbody and posterior element arthrodesis.  Endplate spurring.
Streak artifact affecting visualization of the thecal sac.  No
definite stenosis.
IMPRESSION: 1.  Posterior and interbody fusion sequelae of L4-L5 and L5-L1 with
solid appearing arthrodesis.  No definite stenosis.
2.  Transpedicular fusion at L3-L4 without evidence of hardware
loosening.

## 2013-11-23 ENCOUNTER — Telehealth: Payer: Self-pay

## 2013-11-23 MED ORDER — TAPENTADOL HCL 50 MG PO TABS: 50.0000 mg | ORAL_TABLET | Freq: Two times a day (BID) | ORAL | Status: AC

## 2013-11-23 MED ORDER — TIZANIDINE HCL 4 MG PO TABS: 4.0000 mg | ORAL_TABLET | Freq: Every evening | ORAL | Status: AC | PRN

## 2013-11-23 NOTE — Telephone Encounter (Signed)
Patient request nucynta and tizanidine refill.  Her appointment is not until 12/4 but she will be out of medication before then.  Please advise.  Patient aware she needs to pick up the rx.

## 2013-11-23 NOTE — Telephone Encounter (Signed)
Printed for karen to sign 

## 2013-11-23 NOTE — Telephone Encounter (Signed)
May refill Nucynta 50 mg twice a day #60 no refills May also refill Zanaflex 4 mg per os each bedtime #30 with 5 refills Clydie Braun can sign

## 2013-11-24 NOTE — Telephone Encounter (Signed)
Notified rx available for pick up by personal identified voicemail.

## 2013-12-03 ENCOUNTER — Ambulatory Visit: Payer: Self-pay | Admitting: Physical Medicine & Rehabilitation
